# Patient Record
Sex: Male | Born: 1990 | Race: Black or African American | Hispanic: No | Marital: Single | Smoking: Never smoker
Health system: Southern US, Community
[De-identification: ages and names within clinical notes are randomized; demographics above are authoritative.]

## PROBLEM LIST (undated history)

## (undated) DIAGNOSIS — J45909 Unspecified asthma, uncomplicated: Secondary | ICD-10-CM

---
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
## 2011-10-31 ENCOUNTER — Emergency Department (INDEPENDENT_AMBULATORY_CARE_PROVIDER_SITE_OTHER)
Admission: EM | Admit: 2011-10-31 | Discharge: 2011-10-31 | Disposition: A | Discharge status: Home / Self Care | Payer: Managed Care, Other (non HMO) | Source: Home / Self Care | Attending: Emergency Medicine | Admitting: Emergency Medicine

## 2011-10-31 ENCOUNTER — Encounter (HOSPITAL_COMMUNITY): Payer: Self-pay

## 2011-10-31 ENCOUNTER — Emergency Department (INDEPENDENT_AMBULATORY_CARE_PROVIDER_SITE_OTHER): Payer: Managed Care, Other (non HMO)

## 2011-10-31 DIAGNOSIS — S62309A Unspecified fracture of unspecified metacarpal bone, initial encounter for closed fracture: Secondary | ICD-10-CM

## 2011-10-31 MED ORDER — IBUPROFEN 600 MG PO TABS: 600.0000 mg | ORAL_TABLET | Freq: Four times a day (QID) | ORAL | Status: AC | PRN

## 2011-10-31 MED ORDER — OXYCODONE-ACETAMINOPHEN 5-325 MG PO TABS: 1.0000 | ORAL_TABLET | Freq: Four times a day (QID) | ORAL | Status: AC | PRN

## 2011-10-31 NOTE — ED Notes (Signed)
Pt was playing basketball and fell and hit rt hand on basketball court. Painful and swollen.

## 2011-10-31 NOTE — ED Provider Notes (Signed)
History     CSN: [id number]  Arrival date & time 10/31/11  1121   First MD Initiated Contact with Patient 10/31/11 1136      Chief Complaint  Patient presents with  . Hand Injury    (Consider location/radiation/quality/duration/timing/severity/associated sxs/prior treatment) HPI Comments: Patient is a right-handed male who states he was playing basketball yesterday, and fell, hitting the top of his right hand on the pavement. Now has pain, swelling at the fourth metacarpal. Mildly limited range of motion of his ring finger due to pain and swelling. No tingling, numbness, weakness, bruising, abrasions. Denies any other injury to his wrist, forearm, elbow, shoulder. Has not taken anything for his pain.  Patient is a 21 y.o. male presenting with hand injury. The history is provided by the patient. No language interpreter was used.  Hand Injury  The incident occurred yesterday. [place]. The injury mechanism was a fall. The pain is present in the right hand. The quality of the pain is described as aching and throbbing. The pain has been constant since the incident. Pertinent negatives include no fever. He reports no foreign bodies present. The symptoms are aggravated by movement, palpation and use. He has tried nothing for the symptoms. The treatment provided no relief.    History reviewed. No pertinent past medical history.  History reviewed. No pertinent past surgical history.  History reviewed. No pertinent family history.  History  Substance Use Topics  . Smoking status: Never Smoker   . Smokeless tobacco: Not on file  . Alcohol Use: Yes      Review of Systems  Constitutional: Negative for fever.  Musculoskeletal: Positive for joint swelling.  Skin: Negative for color change, rash and wound.  Neurological: Negative for weakness.    Allergies  Penicillins  Home Medications   Current Outpatient Rx  Name Route Sig Dispense Refill  . IBUPROFEN  600 MG PO TABS Oral Take 1 tablet (600 mg total) by mouth every 6 (six) hours as needed for pain. 30 tablet 0  . OXYCODONE-ACETAMINOPHEN 5-325 MG PO TABS Oral Take 1-2 tablets by mouth every 6 (six) hours as needed for pain. 20 tablet 0    BP 129/77  Pulse 59  Temp(Src) 98.2 F (36.8 C) (Oral)  Resp 15  SpO2 100%  Physical Exam  Nursing note and vitals reviewed. Constitutional: He is oriented to person, place, and time. He appears well-developed and well-nourished.  HENT:  Head: Normocephalic and atraumatic.  Eyes: Conjunctivae and EOM are normal.  Neck: Normal range of motion.  Cardiovascular: Normal rate.   Pulmonary/Chest: Effort normal. No respiratory distress.  Abdominal: He exhibits no distension.  Musculoskeletal: Normal range of motion.       Tenderness, swelling at head of the fourth metacarpal. No bruising.  weakness with ring finger ab/adduction but strength, sensation in median/radial/ulnar nerve distribution otherwise intact.  Fingers with strength 5/5 flexion / extension / FDP / FDS  and 2-point discrimination intact at 5mm. Wrist, forearm, elbow WNL.   Neurological: He is alert and oriented to person, place, and time.  Skin: Skin is warm and dry.  Psychiatric: He has a normal mood and affect. His behavior is normal.    ED Course  Procedures (including critical care time)  Labs Reviewed - No data to display Dg Hand Complete Right  10/31/2011  *RADIOLOGY REPORT*  Clinical Data: Injured right hand while playing basketball.  RIGHT HAND - COMPLETE 3+ VIEW 12/2011:  Comparison: None.  Findings:  Comminuted fracture involving the head of the fourth metacarpal with volar angulation.  Vertically oriented fracture component involving the distal metacarpal shaft.  No other fractures.  Well-preserved bone mineral density.  Well-preserved joint spaces.  IMPRESSION: Fracture involving the head of the fourth metacarpal with volar angulation, with an associated vertically oriented  fracture component involving the distal shaft.  Original Report Authenticated By: Arnell Sieving, M.D.     1. Metacarpal bone fracture      Imaging reviewed by myself. Report per radiologist.   MDM   discussed case with Dr. Izora Ribas, hand on call. Will place him on a gutter splint, Norco, NSAIDs, ice, elevation. He will see the patient in the office tomorrow. Discussed imaging and plan with patient, who agrees with plan  Luiz Blare, MD 10/31/11 2202

## 2011-10-31 NOTE — Discharge Instructions (Signed)
Apply ice to your hands for 20 minutes at a time. Keep it in a splint he until you evaluated by Dr. Izora Ribas. You may need surgery on this, so it is important to followup with him. He will see tomorrow. Return to the ER for uncontrolled pain, fever above 100.4, if you get worse, or for any other concerns.

## 2012-06-20 ENCOUNTER — Ambulatory Visit
Admission: RE | Admit: 2012-06-20 | Discharge: 2012-06-20 | Disposition: A | Discharge status: Home / Self Care | Payer: Managed Care, Other (non HMO) | Source: Ambulatory Visit | Attending: Emergency Medicine | Admitting: Emergency Medicine

## 2012-06-20 ENCOUNTER — Other Ambulatory Visit: Payer: Self-pay | Admitting: Emergency Medicine

## 2012-06-20 DIAGNOSIS — R52 Pain, unspecified: Secondary | ICD-10-CM

## 2014-01-10 ENCOUNTER — Emergency Department (HOSPITAL_COMMUNITY)
Admission: EM | Admit: 2014-01-10 | Discharge: 2014-01-10 | Disposition: A | Discharge status: Home / Self Care | Payer: Managed Care, Other (non HMO) | Attending: Emergency Medicine | Admitting: Emergency Medicine

## 2014-01-10 ENCOUNTER — Encounter (HOSPITAL_COMMUNITY): Payer: Self-pay | Admitting: Emergency Medicine

## 2014-01-10 DIAGNOSIS — Y9367 Activity, basketball: Secondary | ICD-10-CM | POA: Insufficient documentation

## 2014-01-10 DIAGNOSIS — S0993XA Unspecified injury of face, initial encounter: Secondary | ICD-10-CM | POA: Insufficient documentation

## 2014-01-10 DIAGNOSIS — Y9239 Other specified sports and athletic area as the place of occurrence of the external cause: Secondary | ICD-10-CM | POA: Insufficient documentation

## 2014-01-10 DIAGNOSIS — Y92838 Other recreation area as the place of occurrence of the external cause: Secondary | ICD-10-CM

## 2014-01-10 DIAGNOSIS — S0992XA Unspecified injury of nose, initial encounter: Secondary | ICD-10-CM

## 2014-01-10 DIAGNOSIS — Z880 Allergy status to penicillin: Secondary | ICD-10-CM | POA: Insufficient documentation

## 2014-01-10 DIAGNOSIS — W219XXA Striking against or struck by unspecified sports equipment, initial encounter: Secondary | ICD-10-CM | POA: Insufficient documentation

## 2014-01-10 DIAGNOSIS — S199XXA Unspecified injury of neck, initial encounter: Principal | ICD-10-CM

## 2014-01-10 MED ORDER — HYDROCODONE-ACETAMINOPHEN 5-325 MG PO TABS: 1.0000 | ORAL_TABLET | Freq: Four times a day (QID) | ORAL | Status: DC | PRN

## 2014-01-10 MED ORDER — IBUPROFEN 400 MG PO TABS
800.0000 mg | ORAL_TABLET | Freq: Once | ORAL | Status: AC
  Administered 2014-01-10: 800 mg via ORAL
  Filled 2014-01-10: qty 2

## 2014-01-10 NOTE — ED Notes (Signed)
Pt reports playing basketball and being hit to nose by an elbow. Pt now reports 6/10 throbbing pain to nose. Denies epistaxis. Laceration noted to bridge. NAD. Denies LOC, AO x4. Requesting not to have an xray until seen by provider.

## 2014-01-10 NOTE — ED Provider Notes (Signed)
CSN: [id number]     Arrival date & time 01/10/14  2022 History  This chart was scribed for non-physician practitioner Roxy Horsemanobert Siyona Coto, PA-C working with Shelda JakesScott W. Zackowski, MD by Joaquin MusicKristina Sanchez-Matthews, ED Scribe. This patient was seen in room TR10C/TR10C and the patient's care was started at 9:40 PM .   Chief Complaint  Patient presents with  . Facial Injury   The history is provided by the patient. No language interpreter was used.   HPI Comments: Travis Johnson is a 23 y.o. male who presents to the Emergency Department complaining of facial injury that occurred tonight. Pt states he was playing basketball and reports being hit to the nose by an opponents elbow. Reports having a constant throbbing pain sensation. States he is still able to breath from nares. Pt has medication allergies to penicillins. Denies LOC, epistaxis, SOB, and visual disturbances.  History reviewed. No pertinent past medical history. History reviewed. No pertinent past surgical history. History reviewed. No pertinent family history. History  Substance Use Topics  . Smoking status: Never Smoker   . Smokeless tobacco: Not on file  . Alcohol Use: Yes    Review of Systems  HENT: Positive for facial swelling. Negative for nosebleeds.   Eyes: Negative for visual disturbance.  Respiratory: Negative for shortness of breath.   Skin: Positive for wound.  Neurological: Negative for syncope.   Allergies  Penicillins  Home Medications   Prior to Admission medications   Not on File   BP 134/73  Pulse 94  Temp(Src) 98.4 F (36.9 C) (Oral)  Resp 18  Ht 5\' 11"  (1.803 m)  Wt 164 lb (74.39 kg)  BMI 22.88 kg/m2  SpO2 99%  Physical Exam  Nursing note and vitals reviewed. Constitutional: He is oriented to person, place, and time. He appears well-developed and well-nourished. No distress.  HENT:  Head: Normocephalic and atraumatic.  Mild swelling to the R side of nose and infraorbital area. No septal hematoma.  No hemorrhage. Bilateral nares are patent.  Eyes: Conjunctivae and EOM are normal. Pupils are equal, round, and reactive to light. Right eye exhibits no discharge. Left eye exhibits no discharge.  Neck: Neck supple. No tracheal deviation present.  Cardiovascular: Normal rate.   Pulmonary/Chest: Effort normal. No respiratory distress.  Musculoskeletal: Normal range of motion.  Neurological: He is alert and oriented to person, place, and time.  Skin: Skin is warm and dry.  Small shallow laceration to the bridge of the nose, not requiring repair. Bleeding controlled.  Psychiatric: He has a normal mood and affect. His behavior is normal.   ED Course  Procedures  DIAGNOSTIC STUDIES: Oxygen Saturation is 99% on RA, normal by my interpretation.    COORDINATION OF CARE: 9:43 PM-Discussed treatment plan which includes informed pt to administer Ibuprofen while in the ED and F/U with HENT. Will discharge pt with Vicodin. Advised pt to apply ice to wound. Pt agreed to plan.   Labs Review Labs Reviewed - No data to display  Imaging Review No results found.   EKG Interpretation None     MDM   Final diagnoses:  Nose injury    Patient was elbowed in the nose while playing basketball. The septum appears straight. No gross abnormality or deformity. There is some swelling to the right side of the nose. The airway is patent. Both nares are patent. No septal hematoma. Will discharge with HEENT followup. Patient is stable and ready for discharge.  I personally performed the services described in this documentation,  which was scribed in my presence. The recorded information has been reviewed and is accurate.     Roxy Horsemanobert Yvonnie Schinke, PA-C 01/10/14 2154

## 2014-01-10 NOTE — Discharge Instructions (Signed)
Eye Contusion °An eye contusion is a deep bruise of the eye. This is often called a "black eye." Contusions are the result of an injury that caused bleeding under the skin. The contusion may turn blue, purple, or yellow. Minor injuries will give you a painless contusion, but more severe contusions may stay painful and swollen for a few weeks. If the eye contusion only involves the eyelids and tissues around the eye, the injured area will get better within a few days to weeks. However, eye contusions can be serious and affect the eyeball and sight. °CAUSES  °· Blunt injury or trauma to the face or eye area. °· A forehead injury that causes the blood under the skin to work its way down to the eyelids. °· Rubbing the eyes due to irritation. °SYMPTOMS  °· Swelling and redness around the eye. °· Bruising around the eye. °· Tenderness, soreness, or pain around the eye. °· Blurry vision. °· Tearing. °· Eyeball redness. °DIAGNOSIS  °A diagnosis is usually based on a thorough exam of the eye and surrounding area. The eye must be looked at carefully to make sure it is not injured and to make sure nothing else will threaten your vision. A vision test may be done. An X-ray or computed tomography (CT) scan may be needed to determine if there are any associated injuries, such as broken bones (fractures). °TREATMENT  °If there is an injury to the eye, treatment will be determined by the nature of the injury. °HOME CARE INSTRUCTIONS  °· Put ice on the injured area. °· Put ice in a plastic bag. °· Place a towel between your skin and the bag. °· Leave the ice on for 15-20 minutes, 03-04 times a day. °· If it is determined that there is no injury to the eye, you may continue normal activities. °· Sunglasses may be worn to protect your eyes from bright light if light is uncomfortable. °· Sleep with your head elevated. You can put an extra pillow under your head. This may help with discomfort. °· Only take over-the-counter or  prescription medicines for pain, discomfort, or fever as directed by your caregiver. Do not take aspirin for the first few days. This may increase bruising. °SEEK IMMEDIATE MEDICAL CARE IF:  °· You have any form of vision loss. °· You have double vision. °· You feel nauseous. °· You feel dizzy, sleepy, or like you will faint. °· You have any fluid discharge from the eye or your nose. °· You have swelling and discoloration that does not fade. °MAKE SURE YOU:  °· Understand these instructions. °· Will watch your condition. °· Will get help right away if you are not doing well or get worse. °Document Released: 08/20/2000 Document Revised: 11/15/2011 Document Reviewed: 07/09/2011 °[employer]® Patient Information ©2014 [employer]. ° °

## 2014-01-14 NOTE — ED Provider Notes (Signed)
Medical screening examination/treatment/procedure(s) were performed by non-physician practitioner and as supervising physician I was immediately available for consultation/collaboration.   EKG Interpretation None        Shelda JakesScott W. Carloyn Lahue, MD 01/14/14 [phone]

## 2015-02-23 ENCOUNTER — Emergency Department (INDEPENDENT_AMBULATORY_CARE_PROVIDER_SITE_OTHER)
Admission: EM | Admit: 2015-02-23 | Discharge: 2015-02-23 | Disposition: A | Discharge status: Home / Self Care | Payer: Self-pay | Source: Home / Self Care | Attending: Family Medicine | Admitting: Family Medicine

## 2015-02-23 ENCOUNTER — Encounter (HOSPITAL_COMMUNITY): Payer: Self-pay | Admitting: Emergency Medicine

## 2015-02-23 ENCOUNTER — Emergency Department (INDEPENDENT_AMBULATORY_CARE_PROVIDER_SITE_OTHER): Payer: Self-pay

## 2015-02-23 DIAGNOSIS — M659 Synovitis and tenosynovitis, unspecified: Secondary | ICD-10-CM

## 2015-02-23 MED ORDER — NAPROXEN 375 MG PO TABS: 375.0000 mg | ORAL_TABLET | Freq: Two times a day (BID) | ORAL | Status: AC

## 2015-02-23 NOTE — ED Provider Notes (Signed)
Travis Johnson is a 24 y.o. male who presents to Urgent Care today for right hand pain. Patient suffered an injury to his right 3rd MCP 4 months ago. He hit his hand on a dresser. He had pain and swelling for a few months but was doing well until yesterday when he hit his hand on a counter at work. He notes significant swelling and pain at the right third MCP. Pain is worse with motion. No radiating pain weakness or numbness. No treatment tried yet.   History reviewed. No pertinent past medical history. History reviewed. No pertinent past surgical history. History  Substance Use Topics  . Smoking status: Never Smoker   . Smokeless tobacco: Not on file  . Alcohol Use: Yes   ROS as above Medications: No current facility-administered medications for this encounter.   Current Outpatient Prescriptions  Medication Sig Dispense Refill  . naproxen (NAPROSYN) 375 MG tablet Take 1 tablet (375 mg total) by mouth 2 (two) times daily. 20 tablet 0   Allergies  Allergen Reactions  . Penicillins Hives and Rash     Exam:  BP 122/81 mmHg  Pulse 70  Temp(Src) 99 F (37.2 C) (Oral)  Resp 16  SpO2 99% Gen: Well NAD HEENT: EOMI,  MMM Exts: Brisk capillary refill, warm and well perfused.  Right hand swollen and tender right third MCP. Normal hand motion. Capillary refill sensation are intact distally.  No results found for this or any previous visit (from the past 24 hour(s)). Dg Hand Complete Right  02/23/2015   CLINICAL DATA:  Injury 4 months ago  EXAM: RIGHT HAND - COMPLETE 3+ VIEW  COMPARISON:  None.  FINDINGS: No acute fracture.  No dislocation.  IMPRESSION: No acute bony pathology.   Electronically Signed   By: Jolaine Click M.D.   On: 02/23/2015 14:15    Assessment and Plan: 24 y.o. male with traumatic synovitis. Discussed options. Plan for NSAIDs and physical therapy and follow up with sports medicine as needed.  Discussed warning signs or symptoms. Please see discharge instructions.  Patient expresses understanding.     Rodolph Bong, MD 02/23/15 [phone]

## 2015-02-23 NOTE — Discharge Instructions (Signed)
Thank you for coming in today. Take naproxen for pain. Attended hand physical therapy for your finger pain. Return as needed. [facility] Address: [address] [place], [place] [address] [phone]  Return as needed.

## 2015-02-23 NOTE — ED Notes (Addendum)
Reports injury right hand four months ago.  States yesterday hit the same area on a dresser and is having more swelling and pain.    Swelling noted over right middle knuckle.

## 2015-11-09 ENCOUNTER — Encounter (HOSPITAL_COMMUNITY): Payer: Self-pay

## 2015-11-09 ENCOUNTER — Emergency Department (HOSPITAL_COMMUNITY)
Admission: EM | Admit: 2015-11-09 | Discharge: 2015-11-09 | Disposition: A | Discharge status: Home / Self Care | Payer: Self-pay | Attending: Emergency Medicine | Admitting: Emergency Medicine

## 2015-11-09 DIAGNOSIS — J111 Influenza due to unidentified influenza virus with other respiratory manifestations: Secondary | ICD-10-CM | POA: Insufficient documentation

## 2015-11-09 DIAGNOSIS — R1110 Vomiting, unspecified: Secondary | ICD-10-CM | POA: Insufficient documentation

## 2015-11-09 DIAGNOSIS — Z791 Long term (current) use of non-steroidal anti-inflammatories (NSAID): Secondary | ICD-10-CM | POA: Insufficient documentation

## 2015-11-09 DIAGNOSIS — R42 Dizziness and giddiness: Secondary | ICD-10-CM | POA: Insufficient documentation

## 2015-11-09 DIAGNOSIS — Z880 Allergy status to penicillin: Secondary | ICD-10-CM | POA: Insufficient documentation

## 2015-11-09 DIAGNOSIS — R630 Anorexia: Secondary | ICD-10-CM | POA: Insufficient documentation

## 2015-11-09 DIAGNOSIS — J45909 Unspecified asthma, uncomplicated: Secondary | ICD-10-CM | POA: Insufficient documentation

## 2015-11-09 DIAGNOSIS — Z79899 Other long term (current) drug therapy: Secondary | ICD-10-CM | POA: Insufficient documentation

## 2015-11-09 HISTORY — DX: Unspecified asthma, uncomplicated: J45.909

## 2015-11-09 MED ORDER — SODIUM CHLORIDE 0.9 % IV BOLUS (SEPSIS)
1000.0000 mL | Freq: Once | INTRAVENOUS | Status: AC
  Administered 2015-11-09: 1000 mL via INTRAVENOUS

## 2015-11-09 MED ORDER — ONDANSETRON HCL 4 MG/2ML IJ SOLN
4.0000 mg | Freq: Once | INTRAMUSCULAR | Status: AC
  Administered 2015-11-09: 4 mg via INTRAVENOUS
  Filled 2015-11-09: qty 2

## 2015-11-09 MED ORDER — ONDANSETRON HCL 4 MG PO TABS: 4.0000 mg | ORAL_TABLET | Freq: Four times a day (QID) | ORAL | Status: AC

## 2015-11-09 NOTE — ED Notes (Signed)
Patient to ED via [facility] c/o congestion/cough since Friday and fever that started tonight. Upon EMS arrival, fever 101.43F oral, ST 111, 146/97, 98%. EMS gave 1000mg  Tylenol, upon arrival to ED, patient vomited x 1. EMS placed 18g LAC and gave 4mg  Zofran. Patient admits to smoking weed prior to coming in.

## 2015-11-09 NOTE — ED Provider Notes (Signed)
CSN: [id number]     Arrival date & time 11/09/15  0414 History   First MD Initiated Contact with Patient 11/09/15 [phone]     Chief Complaint  Patient presents with  . Cough  . Emesis      Patient is a 25 y.o. male presenting with cough and vomiting. The history is provided by the patient.  Cough Associated symptoms: chills, fever, myalgias and sore throat   Associated symptoms: no chest pain   Emesis Associated symptoms: chills, myalgias and sore throat   Associated symptoms: no diarrhea    patient has been feeling bad the last few days. He has had cough sore throat myalgias and states he's been feeling weak. Minimal production of cough. He has a dull headache. He also has begun to vomit. No diarrhea. No clear sick contacts. He is otherwise healthy. No abdominal pain. States he does ache all over.  Past Medical History  Diagnosis Date  . Asthma    History reviewed. No pertinent past surgical history. History reviewed. No pertinent family history. Social History  Substance Use Topics  . Smoking status: Never Smoker   . Smokeless tobacco: None  . Alcohol Use: Yes    Review of Systems  Constitutional: Positive for fever, chills, appetite change and fatigue.  HENT: Positive for congestion and sore throat.   Respiratory: Positive for cough.   Cardiovascular: Negative for chest pain.  Gastrointestinal: Positive for vomiting. Negative for diarrhea.  Genitourinary: Negative for flank pain.  Musculoskeletal: Positive for myalgias.  Skin: Negative for pallor.  Neurological: Positive for light-headedness.  Hematological: Negative for adenopathy.      Allergies  Penicillins  Home Medications   Prior to Admission medications   Medication Sig Start Date End Date Taking? Authorizing Provider  naproxen (NAPROSYN) 375 MG tablet Take 1 tablet (375 mg total) by mouth 2 (two) times daily. 02/23/15   Rodolph BongEvan S Corey, MD  ondansetron (ZOFRAN) 4 MG tablet Take 1 tablet (4 mg total) by mouth  every 6 (six) hours. 11/09/15   Benjiman CoreNathan Pina Sirianni, MD   BP 130/90 mmHg  Pulse 86  Temp(Src) 99.2 F (37.3 C) (Oral)  Resp 14  SpO2 98% Physical Exam  Constitutional: He appears well-developed.  HENT:  Mouth/Throat: No oropharyngeal exudate.  Posterior pharyngeal erythema without exudate.  Neck: Neck supple.  Cardiovascular: Normal rate.   Pulmonary/Chest: Effort normal.  Abdominal: Soft. There is no tenderness. There is no rebound.  Musculoskeletal: Normal range of motion.  Neurological: He is alert.  Skin: Skin is warm.    ED Course  Procedures (including critical care time) Labs Review Labs Reviewed - No data to display  Imaging Review No results found. I have personally reviewed and evaluated these images and lab results as part of my medical decision-making.   EKG Interpretation None      MDM   Final diagnoses:  Influenza    Patient with fever myalgias cough. Likely influenza. Doubt strep. Lungs are clear without pneumonia. She feels better after IV fluids and tolerating orals will be discharged home.    Benjiman CoreNathan Patricia Perales, MD 11/09/15 [phone]

## 2015-11-09 NOTE — Discharge Instructions (Signed)

## 2016-04-12 IMAGING — DX DG HAND COMPLETE 3+V*R*
3 series · 3 of 3 positions shown · non-contrast
Comparison: None.

CLINICAL DATA: Injury 4 months ago

EXAM:
RIGHT HAND - COMPLETE 3+ VIEW

[hand pa]
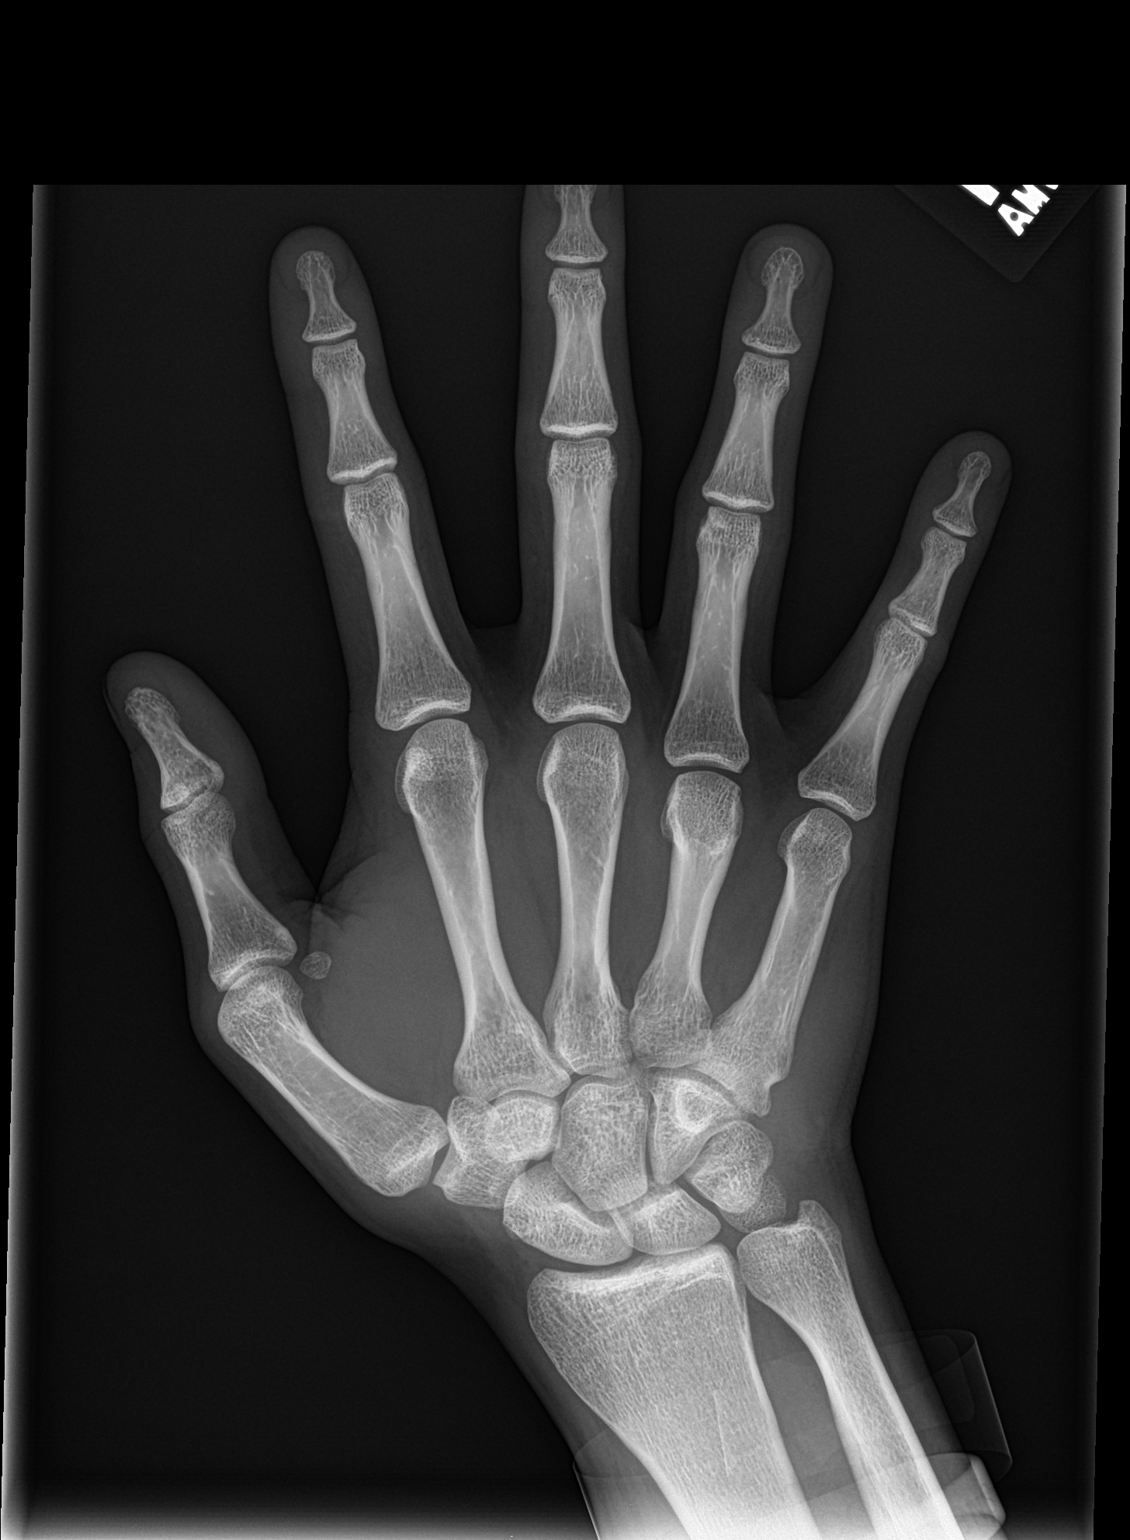

[hand obl]
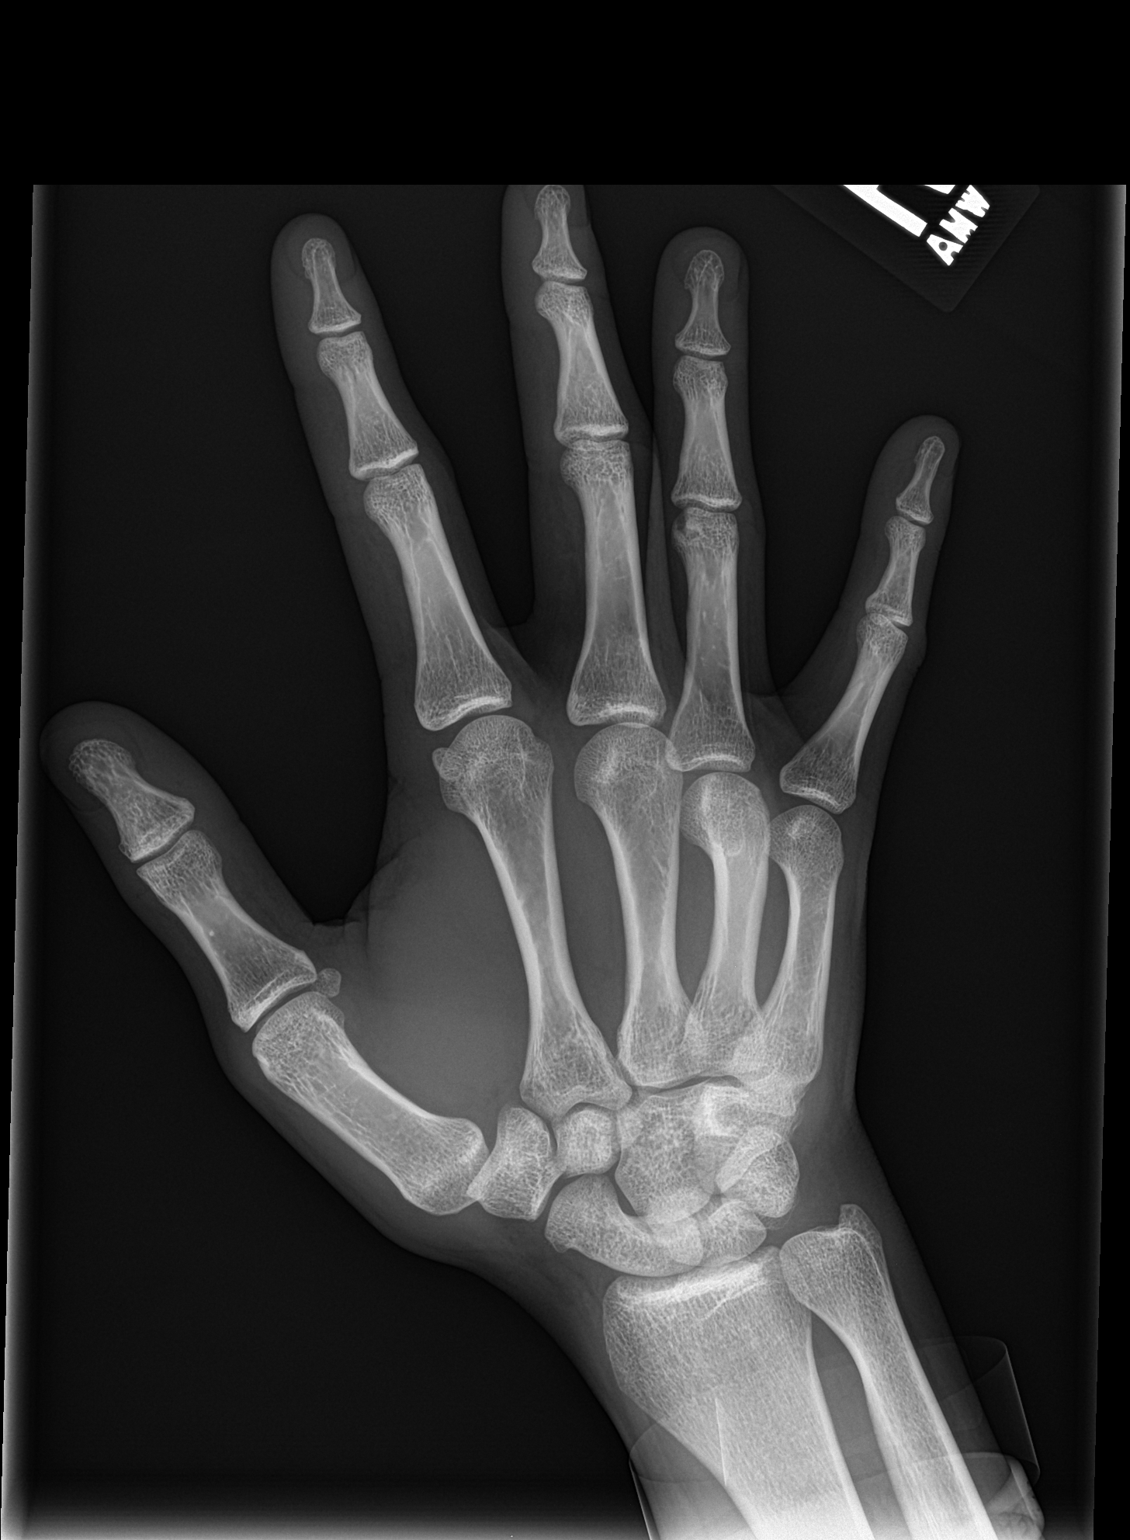

[hand lat]
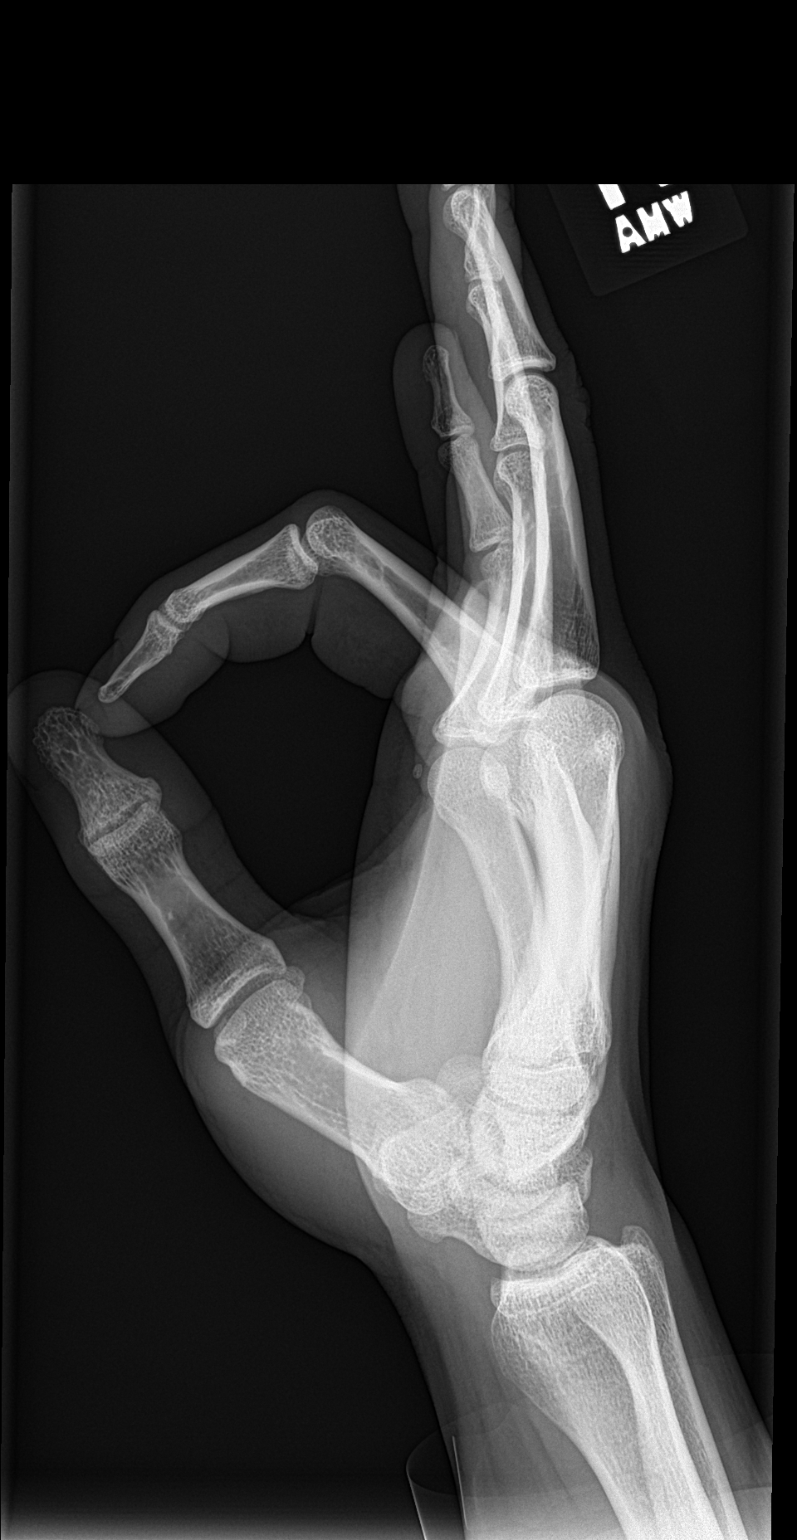

[3 of 3 positions shown; findings below may reference images not displayed]

FINDINGS: No acute fracture.  No dislocation.
IMPRESSION: No acute bony pathology.
